# Patient Record
Sex: Female | Born: 2000 | Race: Black or African American | Hispanic: No | Marital: Single | Smoking: Never smoker
Health system: Southern US, Community
[De-identification: ages and names within clinical notes are randomized; demographics above are authoritative.]

## PROBLEM LIST (undated history)

## (undated) DIAGNOSIS — J45909 Unspecified asthma, uncomplicated: Secondary | ICD-10-CM

## (undated) DIAGNOSIS — E162 Hypoglycemia, unspecified: Secondary | ICD-10-CM

---

## 2020-09-26 ENCOUNTER — Emergency Department (HOSPITAL_COMMUNITY): Payer: Federal, State, Local not specified - PPO

## 2020-09-26 ENCOUNTER — Other Ambulatory Visit: Payer: Self-pay

## 2020-09-26 ENCOUNTER — Encounter (HOSPITAL_COMMUNITY): Payer: Self-pay

## 2020-09-26 ENCOUNTER — Emergency Department (HOSPITAL_COMMUNITY)
Admission: EM | Admit: 2020-09-26 | Discharge: 2020-09-26 | Disposition: A | Discharge status: Home / Self Care | Payer: Federal, State, Local not specified - PPO | Attending: Emergency Medicine | Admitting: Emergency Medicine

## 2020-09-26 DIAGNOSIS — Y93B9 Activity, other involving muscle strengthening exercises: Secondary | ICD-10-CM | POA: Insufficient documentation

## 2020-09-26 DIAGNOSIS — Z79899 Other long term (current) drug therapy: Secondary | ICD-10-CM | POA: Diagnosis not present

## 2020-09-26 DIAGNOSIS — J45909 Unspecified asthma, uncomplicated: Secondary | ICD-10-CM | POA: Diagnosis not present

## 2020-09-26 DIAGNOSIS — X501XXA Overexertion from prolonged static or awkward postures, initial encounter: Secondary | ICD-10-CM | POA: Diagnosis not present

## 2020-09-26 DIAGNOSIS — S83005A Unspecified dislocation of left patella, initial encounter: Secondary | ICD-10-CM | POA: Diagnosis not present

## 2020-09-26 DIAGNOSIS — M25562 Pain in left knee: Secondary | ICD-10-CM | POA: Diagnosis present

## 2020-09-26 HISTORY — DX: Hypoglycemia, unspecified: E16.2

## 2020-09-26 HISTORY — DX: Unspecified asthma, uncomplicated: J45.909

## 2020-09-26 MED ORDER — IBUPROFEN 800 MG PO TABS
800.0000 mg | ORAL_TABLET | Freq: Once | ORAL | Status: AC
  Administered 2020-09-26: 800 mg via ORAL
  Filled 2020-09-26: qty 1

## 2020-09-26 NOTE — ED Notes (Signed)
Patient transported to X-ray 

## 2020-09-26 NOTE — ED Provider Notes (Signed)
[facility]-EMERGENCY DEPT Provider Note   CSN: [id number] Arrival date & time: 09/26/20  1431     History Chief Complaint  Patient presents with  . Knee Injury    Lydia Pennington is a 20 y.o. female presenting for evaluation of left knee pain.  Patient states was prior to arrival she was working out on the floor when she rolled over into a kneeling position and simply felt her left kneecap pop out of place.  She reports acute onset pain.  She has not ambulated since.  No previous history of injury to the left knee or previous dislocation.  She denies numbness or tingling.  She received medication with EMS, improved her pain, she is not taking anything else.  She has no medical problems, takes medications daily.  She has minimal pain at rest, but lots of pain with movement.  HPI     Past Medical History:  Diagnosis Date  . Asthma   . Hypoglycemia     There are no problems to display for this patient.    OB History   No obstetric history on file.     No family history on file.     Home Medications Prior to Admission medications   Medication Sig Start Date End Date Taking? Authorizing Provider  acetaminophen (TYLENOL) 500 MG tablet Take 500 mg by mouth every 6 (six) hours as needed for moderate pain.   Yes [provider]  ferrous sulfate 325 (65 FE) MG EC tablet Take 325 mg by mouth daily.   Yes [provider]    Allergies    Patient has no known allergies.  Review of Systems   Review of Systems  Musculoskeletal: Positive for arthralgias, gait problem and joint swelling.  All other systems reviewed and are negative.   Physical Exam Updated Vital Signs BP 109/71 (BP Location: Left Arm)   Pulse 71   Temp 98.8 F (37.1 C) (Oral)   Resp 14   Ht 5\' 2"  (1.575 m)   Wt 56.7 kg   LMP 09/26/2020 (Exact Date)   SpO2 100%   BMI 22.86 kg/m   Physical Exam Vitals and nursing note reviewed.  Constitutional:      General: She  is not in acute distress.    Appearance: She is well-developed and well-nourished.     Comments: In NAD  HENT:     Head: Normocephalic and atraumatic.  Eyes:     Extraocular Movements: EOM normal.  Cardiovascular:     Rate and Rhythm: Normal rate.  Pulmonary:     Effort: Pulmonary effort is normal.  Abdominal:     General: There is no distension.  Musculoskeletal:        General: Deformity present.     Cervical back: Normal range of motion.     Comments: Obvious deformity of the left knee, left patella is laterally dislocated.  Tenderness palpation of the anterior knee.  No tenderness palpation of calf or thigh.  Pedal pulse 2+.  Good distal sensation.  Full active range of motion of the foot without difficulty.  Skin:    General: Skin is warm.     Capillary Refill: Capillary refill takes less than 2 seconds.     Findings: No rash.  Neurological:     Mental Status: She is alert and oriented to person, place, and time.  Psychiatric:        Mood and Affect: Mood and affect normal.     ED Results /  Procedures / Treatments   Labs (all labs ordered are listed, but only abnormal results are displayed) Labs Reviewed - No data to display  EKG None  Radiology DG Knee Complete 4 Views Left  Result Date: 09/26/2020 CLINICAL DATA:  Dislocation EXAM: LEFT KNEE - COMPLETE 4+ VIEW COMPARISON:  None. FINDINGS: No joint effusion. No acute fracture. Lateral dislocation of the patella. Joint spaces are preserved. IMPRESSION: Lateral dislocation of the patella.  No acute fracture. Electronically Signed   By: Guadlupe Spanish M.D.   On: 09/26/2020 15:50   DG Knee AP/LAT W/Sunrise Left  Result Date: 09/26/2020 CLINICAL DATA:  Reduction of lateral patellar dislocation EXAM: LEFT KNEE 3 VIEWS COMPARISON:  09/26/2020 FINDINGS: Frontal, lateral, and sunrise views of the left knee are obtained. The patellofemoral articulation is anatomic Lea aligned. Joint spaces are well preserved. No joint effusion. No  acute fracture. IMPRESSION: 1. Reduction of prior lateral patellar dislocation, with anatomic alignment in the patellofemoral compartment. Electronically Signed   By: Sharlet Salina M.D.   On: 09/26/2020 16:30    Procedures Reduction of dislocation  Date/Time: 09/26/2020 4:44 PM Performed by: Arby Barrette, MD Authorized by: Arby Barrette, MD  Consent: Verbal consent obtained. Risks and benefits: risks, benefits and alternatives were discussed Consent given by: patient Local anesthesia used: no  Anesthesia: Local anesthesia used: no  Sedation: Patient sedated: no  Patient tolerance: patient tolerated the procedure well with no immediate complications      Medications Ordered in ED Medications  ibuprofen (ADVIL) tablet 800 mg (800 mg Oral Given 09/26/20 1612)    ED Course  I have reviewed the triage vital signs and the nursing notes.  Pertinent labs & imaging results that were available during my care of the patient were reviewed by me and considered in my medical decision making (see chart for details).    MDM Rules/Calculators/A&P                          Patient presenting for evaluation of left knee pain.  On exam, patient has obvious deformity most consistent with a lateral patellar dislocation.  However her leg is currently fully extended, dislocation persist.  As such, will obtain x-rays to ensure no underlying fracture or abnormality.  X-ray viewed interpreted by me, shows lateral dislocation. No obvious fracture. Discussed with attending, Dr. Clarice Pole evaluated the patient. Patellar was reduced by Dr. Clarice Pole without complication or difficulty. Patient reports improvement of pain after dislocation. Will repeat films including sunrise view, place in the immobilizer, give crutches to use as needed. Will have patient follow-up with orthopedics.   Repeat x-ray shows successful reduction, no fracture noted. Discussed rest, ice, elevation. Encourage NSAID use. At this  time, patient appears safe for discharge. Return cautions given. Patient states she understands and agrees to plan.  Final Clinical Impression(s) / ED Diagnoses Final diagnoses:  Dislocation of left patella, initial encounter    Rx / DC Orders ED Discharge Orders    None       Alveria Apley, PA-C 09/26/20 1645    Arby Barrette, MD 09/29/20 [phone]

## 2020-09-26 NOTE — Progress Notes (Signed)
Orthopedic Tech Progress Note Patient Details:  Lydia Pennington August 18, 2000 [id number]  Ortho Devices Type of Ortho Device: Crutches,Knee Immobilizer Ortho Device/Splint Location: left Ortho Device/Splint Interventions: Application   Post Interventions Patient Tolerated: Well Instructions Provided: Care of device   Saul Fordyce 09/26/2020, 4:10 PM

## 2020-09-26 NOTE — ED Notes (Signed)
Ortho tech called for knee immobilizer and crutches 

## 2020-09-26 NOTE — ED Notes (Signed)
Provided ice pack

## 2020-09-26 NOTE — ED Triage Notes (Signed)
Pt came from wellness center at [facility]. Pt was working out on the floor, rolled over into a kneeling position, upon standing pt felt left kneecap pop out of place. Strong peripheral pulses. Pt denies any trauma or past injury to left knee.  A&O x4 Normally ambulatory 20 in right wrist, 150 mcg of pain Pain 2/10 while immobile No PMH or allergies 110/78 62 bpm 98% on RA 18 RR NSR

## 2020-09-26 NOTE — ED Provider Notes (Incomplete)
I provided a substantive portion of the care of this patient.  I personally performed the entirety of the {Shared Choices:25152} for this encounter. {Remember to document shared critical care using "edcritical" dot phrase:1}    

## 2020-09-26 NOTE — Discharge Instructions (Signed)
Limit walking, standing, impact, and repetitive bending, particularly if these activities cause pain. Apply ice and elevate the leg for 10 to 15 minutes four times daily. Use a brace during the day until you follow up with orthopedics.  Take ibuprofen 3 times a day with meals for the next week.  Do not take other anti-inflammatories at the same time (Advil, Motrin, naproxen, Aleve). You may supplement with Tylenol if you need further pain control. Follow-up with the orthopedic doctor listed below for further evaluation of your knee. Return to the emergency room with any new, worsening, concerning symptoms

## 2020-10-09 ENCOUNTER — Ambulatory Visit (INDEPENDENT_AMBULATORY_CARE_PROVIDER_SITE_OTHER): Payer: Federal, State, Local not specified - PPO | Admitting: Rehabilitative and Restorative Service Providers"

## 2020-10-09 ENCOUNTER — Other Ambulatory Visit: Payer: Self-pay

## 2020-10-09 ENCOUNTER — Ambulatory Visit: Payer: Self-pay

## 2020-10-09 ENCOUNTER — Encounter: Payer: Self-pay | Admitting: Rehabilitative and Restorative Service Providers"

## 2020-10-09 ENCOUNTER — Ambulatory Visit: Payer: Federal, State, Local not specified - PPO | Admitting: Orthopedic Surgery

## 2020-10-09 ENCOUNTER — Encounter: Payer: Self-pay | Admitting: Orthopedic Surgery

## 2020-10-09 DIAGNOSIS — R600 Localized edema: Secondary | ICD-10-CM

## 2020-10-09 DIAGNOSIS — M25662 Stiffness of left knee, not elsewhere classified: Secondary | ICD-10-CM | POA: Diagnosis not present

## 2020-10-09 DIAGNOSIS — S83005A Unspecified dislocation of left patella, initial encounter: Secondary | ICD-10-CM

## 2020-10-09 DIAGNOSIS — M6281 Muscle weakness (generalized): Secondary | ICD-10-CM

## 2020-10-09 DIAGNOSIS — R262 Difficulty in walking, not elsewhere classified: Secondary | ICD-10-CM

## 2020-10-09 NOTE — Progress Notes (Signed)
Office Visit Note   Patient: Lydia Pennington           Date of Birth: September 02, 2000           MRN: [id number] Visit Date: 10/09/2020 Requested by: No referring provider defined for this encounter. PCP: System, Provider Not In  Subjective: Chief Complaint  Patient presents with  . Left Knee - Pain, Follow-up    HPI: Lydia Pennington is a 20 y.o. female who presents to the office complaining of left knee pain.  Patient states that she was doing back squats with about 40 to 50 pounds on her back when she felt a pop in her left knee.  She reports lateral dislocation of the patella with subsequent reduction in the emergency department.  She has been ambulating full weightbearing in a knee immobilizer since then.  She states that she is doing well.  This is her first patellar dislocation.  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  Injury was about 2 weeks ago.  No other history of left knee injuries.  No history of left knee surgery..                ROS: All systems reviewed are negative as they relate to the chief complaint within the history of present illness.  Patient denies fevers or chills.  Assessment & Plan: Visit Diagnoses:  1. Closed dislocation of left patella, initial encounter     Plan: Patient is a 20 year old female who presents complaining of left knee pain.  She sustained first-time patella dislocation on 09/26/2020 while she was doing back squats in the gym.  This was reduced in the ED and she has been ambulating in a knee immobilizer.  Doing well in regards to pain control.  About 2 weeks out from injury now.  No significant tenderness on exam or any increased MPFL laxity.  Patient does have apprehension with lateral manipulation of the patella on the left compared to the right.  Plan to transition from knee immobilizer into a patella stabilizing brace to wear at all times.  Refer patient to physical therapy upstairs to work on patella stabilizing  exercises and quadricep strengthening.  Follow-up in 6 weeks for clinical recheck.  No need for MRI scan at this time but plan for scan if she has a recurrent dislocation.  No clinical or radiographic evidence of loose body or chondral defect.  Follow-Up Instructions: No follow-ups on file.   Orders:  Orders Placed This Encounter  Procedures  . XR KNEE 3 VIEW LEFT  . Ambulatory referral to Physical Therapy   No orders of the defined types were placed in this encounter.     Procedures: No procedures performed   Clinical Data: No additional findings.  Objective: Vital Signs: LMP 09/26/2020 (Exact Date)   Physical Exam:  Constitutional: Patient appears well-developed HEENT:  Head: Normocephalic Eyes:EOM are normal Neck: Normal range of motion Cardiovascular: Normal rate Pulmonary/chest: Effort normal Neurologic: Patient is alert Skin: Skin is warm Psychiatric: Patient has normal mood and affect  Ortho Exam: Ortho exam demonstrates left knee without effusion.  Positive patellar apprehension on the left that is not present on the right.  No increased MPFL laxity of the left knee compared with the right knee.  No tenderness over the medial lateral joint lines.  Negative Lachman exam.  Active anterior/posterior drawer.  No instability to varus/valgus stress.  No calf tenderness.  Negative Homans' sign.  No  tenderness over the medial aspect of the patella or the MPFL insertion on the femur.  Specialty Comments:  No specialty comments available.  Imaging: No results found.   PMFS History: There are no problems to display for this patient.  Past Medical History:  Diagnosis Date  . Asthma   . Hypoglycemia     No family history on file.  No past surgical history on file. Social History   Occupational History  . Not on file  Tobacco Use  . Smoking status: Not on file  . Smokeless tobacco: Not on file  Substance and Sexual Activity  . Alcohol use: Not on file  . Drug  use: Not on file  . Sexual activity: Not on file

## 2020-10-09 NOTE — Therapy (Signed)
[facility] Physical Therapy [address] [place], [place], [address] Phone: [phone]   Fax:  [phone]  Physical Therapy Evaluation  Patient Details  Name: Lydia Pennington MRN: [id number] Date of Birth: [birth year] Referring Provider (PT): Cammy Copa MD   Encounter Date: 10/09/2020   PT End of Session - 10/09/20 1617    Visit Number 1    Number of Visits 8    PT Start Time 1437    PT Stop Time 1513    PT Time Calculation (min) 36 min    Equipment Utilized During Treatment Left knee immobilizer    Activity Tolerance Patient tolerated treatment well;No increased pain;Patient limited by fatigue    Behavior During Therapy [facility] for tasks assessed/performed           Past Medical History:  Diagnosis Date  . Asthma   . Hypoglycemia     History reviewed. No pertinent surgical history.  There were no vitals filed for this visit.    Subjective Assessment - 10/09/20 1445    Subjective 09/26/20 working out at the gym dislocated L knee at bottom of squat.  Pain but not requiring pain medication.    Pertinent History Asthma    Limitations Standing;Walking    How long can you sit comfortably? Stiffens with prolonged sitting    How long can you stand comfortably? 2 hours    How long can you walk comfortably? 15 minutes    Patient Stated Goals Get out of brace and back to normal activities    Currently in Pain? No/denies              [facility] PT Assessment - 10/09/20 0001      Assessment   Medical Diagnosis L patellar dislocation    Referring Provider (PT) Cammy Copa MD    Onset Date/Surgical Date 09/26/20      Precautions   Required Braces or Orthoses Knee Immobilizer - Left    Knee Immobilizer - Left On when out of bed or walking      Restrictions   Weight Bearing Restrictions No      Balance Screen   Has the patient fallen in the past 6 months No    Has the patient had a decrease in activity level because of a fear of falling?  No     Is the patient reluctant to leave their home because of a fear of falling?  No      Prior Function   Level of Independence Independent    Vocation Student    Leisure Regular at the gym      Cognition   Overall Cognitive Status Within Functional Limits for tasks assessed      Observation/Other Assessments   Focus on Therapeutic Outcomes (FOTO)  63 (Goal 90)      ROM / Strength   AROM / PROM / Strength AROM;Strength      AROM   Overall AROM  Deficits    AROM Assessment Site Knee    Right/Left Knee Left;Right    Right Knee Extension 0    Right Knee Flexion 148    Left Knee Extension 0    Left Knee Flexion 140      Strength   Overall Strength Comments L 50.5 cm/R 51.5 cm thigh girth assessed 15 cm proximal to the superior patellar pole                      Objective measurements completed on  examination: See above findings.       [facility] Adult PT Treatment/Exercise - 10/09/20 0001      Exercises   Exercises Knee/Hip      Knee/Hip Exercises: Seated   Long Arc Quad Strengthening;Both;4 sets;5 reps    Long Arc Quad Limitations Seated straight leg raises      Knee/Hip Exercises: Supine   Quad Sets Strengthening;Both;2 sets;10 reps;Other (comment)   5 seconds B                 PT Education - 10/09/20 1614    Education Details Reviewed exam findings, discussed avoiding locking knees out and reviewed POC and starter HEP.    Person(s) Educated Patient    Methods Explanation;Demonstration;Verbal cues;Handout    Comprehension Verbal cues required;Need further instruction;Returned demonstration;Verbalized understanding            PT Short Term Goals - 10/09/20 1621      PT SHORT TERM GOAL #1   Title Lydia Pennington will be independent with her home and gym program.    Time 4    Period Weeks    Status New    Target Date 11/06/20             PT Long Term Goals - 10/09/20 1622      PT LONG TERM GOAL #1   Title Lydia Pennington will have < 0.5 cm L thigh  atrophy assessed 15 cm proximal to the superior patellar pole.    Baseline 1 cm    Time 8    Period Weeks    Status New    Target Date 12/04/20      PT LONG TERM GOAL #2   Title Lydia Pennington will be able to return to the gym without any resitritions.    Time 8    Period Weeks    Status New    Target Date 12/04/20      PT LONG TERM GOAL #3   Title Improve FOTO to 90.    Baseline 63    Time 8    Period Weeks    Status New    Target Date 12/04/20      PT LONG TERM GOAL #4   Title Lydia Pennington will be independent with her long-term HEP.    Time 8    Period Weeks    Status New    Target Date 12/04/20                  Plan - 10/09/20 1618    Clinical Impression Statement Lydia Pennington dislocated her L patella when doing squats in the gym.  She is a workout regular and has decent quadriceps strength (1 cm atrophy assessed 15 cm proximal to the superior patellar pole).  AROM is mildly limited and quadriceps strength/knee stability will be the focus of her home exercise and clinic program.    Examination-Activity Limitations Squat;Bend;Locomotion Level;Stairs;Stand    Examination-Participation Restrictions Occupation;Community Activity    Stability/Clinical Decision Making Stable/Uncomplicated    Clinical Decision Making Low    Rehab Potential Good    PT Frequency 1x / week    PT Duration 8 weeks    PT Treatment/Interventions ADLs/Self Care Home Management;Cryotherapy;Retail banker;Therapeutic activities;Neuromuscular re-education;Balance training;Therapeutic exercise;Patient/family education;Vasopneumatic Device    PT Next Visit Plan Progress to machines she can do at the [facility] gym and proprioception along with increased quadriceps strength challenge.    PT Home Exercise Plan Access Code: [email or web address]    Consulted and Agree with Plan  of Care Patient           Patient will benefit from skilled therapeutic intervention in order to improve the following  deficits and impairments:  Abnormal gait,Decreased endurance,Decreased range of motion,Decreased strength,Difficulty walking,Increased edema,Pain  Visit Diagnosis: Difficulty in walking, not elsewhere classified  Localized edema  Muscle weakness (generalized)  Stiffness of left knee, not elsewhere classified     Problem List There are no problems to display for this patient.   Cherlyn Cushing PT, MPT 10/09/2020, 4:25 PM  [facility] Physical Therapy [address] [place], [place], [address] Phone: [phone]   Fax:  [phone]  Name: Lydia Pennington MRN: [id number] Date of Birth: 10-20-2000

## 2020-10-09 NOTE — Patient Instructions (Signed)
Access Code: [email or web address] URL: [email or web address] Date: 10/09/2020 Prepared by: Pauletta Browns  Exercises Supine Quadricep Sets - 2-3 x daily - 7 x weekly - 3-5 sets - 10 reps - 5 seconds hold Small Range Straight Leg Raise - 2-3 x daily - 7 x weekly - 4-10 sets - 5 reps - 3 seconds hold

## 2020-10-17 ENCOUNTER — Encounter: Payer: Self-pay | Admitting: Rehabilitative and Restorative Service Providers"

## 2020-10-17 ENCOUNTER — Other Ambulatory Visit: Payer: Self-pay

## 2020-10-17 ENCOUNTER — Ambulatory Visit (INDEPENDENT_AMBULATORY_CARE_PROVIDER_SITE_OTHER): Payer: Federal, State, Local not specified - PPO | Admitting: Rehabilitative and Restorative Service Providers"

## 2020-10-17 DIAGNOSIS — R600 Localized edema: Secondary | ICD-10-CM

## 2020-10-17 DIAGNOSIS — M25662 Stiffness of left knee, not elsewhere classified: Secondary | ICD-10-CM

## 2020-10-17 DIAGNOSIS — M6281 Muscle weakness (generalized): Secondary | ICD-10-CM | POA: Diagnosis not present

## 2020-10-17 DIAGNOSIS — R262 Difficulty in walking, not elsewhere classified: Secondary | ICD-10-CM

## 2020-10-17 NOTE — Therapy (Signed)
[facility] Physical Therapy [address] [place], [place], [address] Phone: [phone]   Fax:  [phone]  Physical Therapy Treatment  Patient Details  Name: Lydia Pennington MRN: [id number] Date of Birth: 2001-02-26 Referring Provider (PT): Cammy Copa MD   Encounter Date: 10/17/2020   PT End of Session - 10/17/20 1151    Visit Number 2    Number of Visits 8    Date for PT Re-Evaluation 12/04/20    Progress Note Due on Visit 10    PT Start Time 1144    PT Stop Time 1223    PT Time Calculation (min) 39 min    Equipment Utilized During Treatment Left knee immobilizer   on upon arrival   Activity Tolerance Patient tolerated treatment well;No increased pain    Behavior During Therapy WFL for tasks assessed/performed           Past Medical History:  Diagnosis Date  . Asthma   . Hypoglycemia     History reviewed. No pertinent surgical history.  There were no vitals filed for this visit.   Subjective Assessment - 10/17/20 1149    Subjective Pt. reported no pain since last visit.  Doing well c HEP.  Pt. stated she tried to get brace but they were closed so still wearing immobilizer.    Pertinent History Asthma    Limitations Standing;Walking    How long can you sit comfortably? Stiffens with prolonged sitting    How long can you stand comfortably? 2 hours    How long can you walk comfortably? 15 minutes    Patient Stated Goals Get out of brace and back to normal activities    Currently in Pain? No/denies              [facility] PT Assessment - 10/17/20 0001      Assessment   Medical Diagnosis Lt patellar dislocation    Referring Provider (PT) Cammy Copa MD    Onset Date/Surgical Date 09/26/20      Precautions   Required Braces or Orthoses Other Brace/Splint    Knee Immobilizer - Left Other (comment)   MD note prior to PT evaluation:  "Plan to transition from knee immobilizer into a patella stabilizing brace to wear at all times."    Other Brace/Splint Patellar stabilization brace                         [facility] Adult PT Treatment/Exercise - 10/17/20 0001      Knee/Hip Exercises: Aerobic   Recumbent Bike Lvl 3 10 mins      Knee/Hip Exercises: Machines for Strengthening   Cybex Leg Press Lt LE SL 3 x 15 75 lbs, performed bilateral c eccentric lowering focus      Knee/Hip Exercises: Standing   Other Standing Knee Exercises wall sit to fatigue x 3      Knee/Hip Exercises: Seated   Other Seated Knee/Hip Exercises SLR 2 x 15 bilateral      Knee/Hip Exercises: Supine   Bridges with Ball Squeeze 3 sets;15 reps    Straight Leg Raises 15 reps;Both      Knee/Hip Exercises: Sidelying   Other Sidelying Knee/Hip Exercises side plank on knee c contralateral SLR 2 x 10 bilateral                  PT Education - 10/17/20 1202    Education Details HEP progression    Person(s) Educated Patient  Methods Explanation;Demonstration;Verbal cues;Handout    Comprehension Returned demonstration;Verbalized understanding            PT Short Term Goals - 10/17/20 1150      PT SHORT TERM GOAL #1   Title [name] will be independent with her home and gym program.    Time 4    Period Weeks    Status On-going    Target Date 11/06/20             PT Long Term Goals - 10/09/20 1622      PT LONG TERM GOAL #1   Title [name] will have < 0.5 cm L thigh atrophy assessed 15 cm proximal to the superior patellar pole.    Baseline 1 cm    Time 8    Period Weeks    Status New    Target Date 12/04/20      PT LONG TERM GOAL #2   Title [name] will be able to return to the gym without any resitritions.    Time 8    Period Weeks    Status New    Target Date 12/04/20      PT LONG TERM GOAL #3   Title Improve FOTO to 90.    Baseline 63    Time 8    Period Weeks    Status New    Target Date 12/04/20      PT LONG TERM GOAL #4   Title [name] will be independent with her long-term HEP.    Time 8    Period  Weeks    Status New    Target Date 12/04/20                 Plan - 10/17/20 1203    Clinical Impression Statement No indications of pain or abnormal response to HEP or additional intervention performed in clinic.  Continued progressive strengthening and movement to non compliant to compliant surface balance to improve neuro muscular control indicated in future visits.    Examination-Activity Limitations Squat;Bend;Locomotion Level;Stairs;Stand    Examination-Participation Restrictions Occupation;Community Activity    Stability/Clinical Decision Making Stable/Uncomplicated    Rehab Potential Good    PT Frequency 1x / week    PT Duration 8 weeks    PT Treatment/Interventions ADLs/Self Care Home Management;Cryotherapy;Retail banker;Therapeutic activities;Neuromuscular re-education;Balance training;Therapeutic exercise;Patient/family education;Vasopneumatic Device    PT Next Visit Plan Continue progressive strengthening intervention progression, neuro muscular non compliant to compliant surface progression. Continue follow up on Pt. getting new brace.  BFR may be helpful in clinic activity.    PT Home Exercise Plan Access Code: [email or web address]    Consulted and Agree with Plan of Care Patient           Patient will benefit from skilled therapeutic intervention in order to improve the following deficits and impairments:  Abnormal gait,Decreased endurance,Decreased range of motion,Decreased strength,Difficulty walking,Increased edema,Pain  Visit Diagnosis: Stiffness of left knee, not elsewhere classified  Muscle weakness (generalized)  Difficulty in walking, not elsewhere classified  Localized edema     Problem List There are no problems to display for this patient.   Chyrel Masson, PT, DPT, OCS, ATC 10/17/20  12:13 PM    [facility] Physical Therapy [address] [place], [place], [address] Phone: [phone]   Fax:   [phone]  Name: Lydia Pennington MRN: [id number] Date of Birth: [birth year]

## 2020-10-17 NOTE — Patient Instructions (Signed)
Access Code: [email or web address] URL: [email or web address] Date: 10/17/2020 Prepared by: Chyrel Masson  Exercises Supine Quadricep Sets - 2-3 x daily - 7 x weekly - 3-5 sets - 10 reps - 5 seconds hold Small Range Straight Leg Raise - 2-3 x daily - 7 x weekly - 4-10 sets - 5 reps - 3 seconds hold Supine Bridge with Mini Swiss Ball Between Knees - 2-3 x daily - 7 x weekly - 3 sets - 10 reps Modified Side Plank with Hip Abduction - 2-3 x daily - 7 x weekly - 3 sets - 10 reps Wall Quarter Squat - 2-3 x daily - 7 x weekly - 1 sets - 5 reps

## 2020-10-24 ENCOUNTER — Encounter: Payer: Federal, State, Local not specified - PPO | Admitting: Rehabilitative and Restorative Service Providers"

## 2020-10-24 ENCOUNTER — Telehealth: Payer: Self-pay | Admitting: Rehabilitative and Restorative Service Providers"

## 2020-10-24 NOTE — Telephone Encounter (Signed)
Spoke with Lydia Pennington.  She forgot her appointment but will be here next Friday at 8:45 AM.

## 2020-10-31 ENCOUNTER — Ambulatory Visit (INDEPENDENT_AMBULATORY_CARE_PROVIDER_SITE_OTHER): Payer: Federal, State, Local not specified - PPO | Admitting: Rehabilitative and Restorative Service Providers"

## 2020-10-31 ENCOUNTER — Encounter: Payer: Self-pay | Admitting: Rehabilitative and Restorative Service Providers"

## 2020-10-31 ENCOUNTER — Other Ambulatory Visit: Payer: Self-pay

## 2020-10-31 DIAGNOSIS — R600 Localized edema: Secondary | ICD-10-CM | POA: Diagnosis not present

## 2020-10-31 DIAGNOSIS — M6281 Muscle weakness (generalized): Secondary | ICD-10-CM

## 2020-10-31 DIAGNOSIS — R262 Difficulty in walking, not elsewhere classified: Secondary | ICD-10-CM

## 2020-10-31 DIAGNOSIS — M25662 Stiffness of left knee, not elsewhere classified: Secondary | ICD-10-CM

## 2020-10-31 NOTE — Therapy (Signed)
[facility] Physical Therapy [address] [place], [place], [address] Phone: [phone]   Fax:  [phone]  Physical Therapy Treatment/Discharge  Patient Details  Name: Lydia Pennington MRN: [id number] Date of Birth: Jan 17, 2001 Referring Provider (PT): Meredith Pel MD  PHYSICAL THERAPY DISCHARGE SUMMARY  Visits from [facility]: 2  Current functional level related to goals / functional outcomes: See note   Remaining deficits: See note   Education / Equipment: HEP  Plan: Patient agrees to discharge.  Patient goals were not met. Patient is being discharged due to meeting the stated rehab goals.  ?????     Encounter Date: 10/31/2020   PT End of Session - 10/31/20 1633    Visit Number 3    Number of Visits 8    Date for PT Re-Evaluation 12/04/20    Progress Note Due on Visit 10    PT Start Time 0902    PT Stop Time 0930    PT Time Calculation (min) 28 min    Equipment Utilized During Treatment Left knee immobilizer   on upon arrival   Activity Tolerance Patient tolerated treatment well;No increased pain    Behavior During Therapy WFL for tasks assessed/performed           Past Medical History:  Diagnosis Date  . Asthma   . Hypoglycemia     History reviewed. No pertinent surgical history.  There were no vitals filed for this visit.   Subjective Assessment - 10/31/20 0911    Subjective Lydia Pennington reports minimal pain this week and no instability.    Pertinent History Asthma    Limitations Standing;Walking    How long can you sit comfortably? Stiffens with prolonged sitting    How long can you stand comfortably? Unlimited (was 2 hours)    How long can you walk comfortably? 30 minutes (was 15 minutes)    Patient Stated Goals Get out of brace and back to normal activities    Currently in Pain? No/denies    Multiple Pain Sites No              [facility] PT Assessment - 10/31/20 0001      Observation/Other Assessments   Focus on  Therapeutic Outcomes (FOTO)  99 (was 63, Goal 90)                         [facility] Adult PT Treatment/Exercise - 10/31/20 0001      Exercises   Exercises Knee/Hip      Knee/Hip Exercises: Seated   Long Arc Quad Strengthening;Both;4 sets;5 reps    Long Arc Quad Weight 2 lbs.    Long Arc Quad Limitations Seated straight leg raises                  PT Education - 10/31/20 [phone]    Education Details Reviewed importance of HEP compliance.  Reviewed HEP.    Person(s) Educated Patient    Methods Explanation;Demonstration;Verbal cues    Comprehension Verbalized understanding;Returned demonstration;Verbal cues required            PT Short Term Goals - 10/31/20 0922      PT SHORT TERM GOAL #1   Title Lydia Pennington will be independent with her home and gym program.    Time 4    Period Weeks    Status Achieved    Target Date 11/06/20             PT Long Term Goals -  10/31/20 0922      PT LONG TERM GOAL #1   Title Lydia Pennington will have < 0.5 cm L thigh atrophy assessed 15 cm proximal to the superior patellar pole.    Baseline 1 cm    Time 8    Period Weeks    Status On-going      PT LONG TERM GOAL #2   Title Lydia Pennington will be able to return to the gym without any resitritions.    Time 8    Period Weeks    Status Achieved      PT LONG TERM GOAL #3   Title Improve FOTO to 90.    Baseline 99, was 63    Time 8    Period Weeks    Status Achieved      PT LONG TERM GOAL #4   Title Eureka will be independent with her long-term HEP.    Time 8    Period Weeks    Status Achieved                 Plan - 10/31/20 1633    Clinical Impression Statement Lydia Pennington has had no instability, pain or subluxations since starting PT.  She has demonstrated independence and compliance with her home exercise program emphasizing eccentric quadriceps strength.  With consistent HEP compliance, remaining atrophy should improve and all long-term goals should be met.  Lydia Pennington was given the  option of independent rehabilitation or continued supervised PT.  She chose to continue independently.    Examination-Activity Limitations Squat;Bend;Locomotion Level;Stairs;Stand    Examination-Participation Restrictions Occupation;Community Activity    Stability/Clinical Decision Making Stable/Uncomplicated    Rehab Potential Good    PT Frequency 1x / week    PT Duration 8 weeks    PT Treatment/Interventions ADLs/Self Care Home Management;Cryotherapy;Occupational psychologist;Therapeutic activities;Neuromuscular re-education;Balance training;Therapeutic exercise;Patient/family education;Vasopneumatic Device    PT Next Visit Plan Continue progressive strengthening intervention progression, neuro muscular non compliant to compliant surface progression. Continue follow up on Pt. getting new brace.  BFR may be helpful in clinic activity.    PT Home Exercise Plan Access Code: [email or web address]    Consulted and Agree with Plan of Care Patient           Patient will benefit from skilled therapeutic intervention in order to improve the following deficits and impairments:  Abnormal gait,Decreased endurance,Decreased range of motion,Decreased strength,Difficulty walking,Increased edema,Pain  Visit Diagnosis: Muscle weakness (generalized)  Difficulty in walking, not elsewhere classified  Localized edema  Stiffness of left knee, not elsewhere classified     Problem List There are no problems to display for this patient.   Lydia Pennington PT, MPT 10/31/2020, 4:38 PM  [facility] Physical Therapy [address] [place], [place], [address] Phone: [phone]   Fax:  [phone]  Name: Lydia Pennington MRN: [id number] Date of Birth: 2001-01-26

## 2020-11-05 ENCOUNTER — Encounter: Payer: Federal, State, Local not specified - PPO | Admitting: Rehabilitative and Restorative Service Providers"

## 2020-11-19 ENCOUNTER — Ambulatory Visit (INDEPENDENT_AMBULATORY_CARE_PROVIDER_SITE_OTHER): Payer: Federal, State, Local not specified - PPO | Admitting: Orthopedic Surgery

## 2020-11-19 ENCOUNTER — Other Ambulatory Visit: Payer: Self-pay

## 2020-11-19 DIAGNOSIS — S83005A Unspecified dislocation of left patella, initial encounter: Secondary | ICD-10-CM | POA: Diagnosis not present

## 2020-11-23 ENCOUNTER — Encounter: Payer: Self-pay | Admitting: Orthopedic Surgery

## 2020-11-23 NOTE — Progress Notes (Signed)
Office Visit Note   Patient: Lydia Pennington           Date of Birth: Oct 18, 2000           MRN: [id number] Visit Date: 11/19/2020 Requested by: No referring provider defined for this encounter. PCP: System, Provider Not In  Subjective: Chief Complaint  Patient presents with  . Left Knee - Follow-up    HPI: Lydia Pennington is a 20 y.o. female who presents to the office for follow-up of left knee patellar dislocation.  She returns for reevaluation.  She is a first-time patellar dislocator.  She has been going to physical therapy and using patellar stabilizing brace.  She has completed physical therapy and has transition to home exercise program focusing on quadricep strengthening.  She is also return to the gym but has not returned to doing back squats.  She is doing some body weight squats.  She uses the brace when she is working out.  Overall she feels pretty much back to normal and denies any recurrent instability or any persistent pain or discomfort.  She is currently a Consulting civil engineer and works at [employer] and her patellar dislocation has not affected her studies or work life.              ROS: All systems reviewed are negative as they relate to the chief complaint within the history of present illness.  Patient denies fevers or chills.  Assessment & Plan: Visit Diagnoses:  1. Closed dislocation of left patella, initial encounter     Plan: Patient is a 20 year old female who presents for reevaluation following first-time left knee patellar dislocation.  She has completed physical therapy and is doing home exercise program.  She has returned to the gym but has not returned doing back squats.  She is using her patellar brace at the gym when she is exercising.  Overall she is doing very well and has no complaints of discomfort or recurrent instability.  Recommended patient keep up with her home exercise program to prevent any future dislocation events.  Encouraged her to return to the office if she  has another instability event as that may necessitate further imaging with MRI to rule out MPFL rupture.  Patient agreed with this plan.  Follow-up as needed.  Follow-Up Instructions: No follow-ups on file.   Orders:  No orders of the defined types were placed in this encounter.  No orders of the defined types were placed in this encounter.     Procedures: No procedures performed   Clinical Data: No additional findings.  Objective: Vital Signs: There were no vitals taken for this visit.  Physical Exam:  Constitutional: Patient appears well-developed HEENT:  Head: Normocephalic Eyes:EOM are normal Neck: Normal range of motion Cardiovascular: Normal rate Pulmonary/chest: Effort normal Neurologic: Patient is alert Skin: Skin is warm Psychiatric: Patient has normal mood and affect  Ortho Exam: Ortho exam demonstrates left knee without effusion.  No tenderness over the attachments of the MPFL.  No patellar apprehension.  No increased laxity with lateral movement of the left patella compared with the contralateral side.  No calf tenderness.  No joint line tenderness.  Negative patellar grind test.  Able to perform straight leg raise.  0 degrees of extension and greater than 125 degrees of knee flexion.  Specialty Comments:  No specialty comments available.  Imaging: No results found.   PMFS History: There are no problems to display for this patient.  Past Medical History:  Diagnosis Date  .  Asthma   . Hypoglycemia     No family history on file.  No past surgical history on file. Social History   Occupational History  . Not on file  Tobacco Use  . Smoking status: Never Smoker  . Smokeless tobacco: Never Used  Substance and Sexual Activity  . Alcohol use: Not on file  . Drug use: Not on file  . Sexual activity: Not on file

## 2022-06-25 IMAGING — CR DG KNEE COMPLETE 4+V*L*
4 series · 4 of 4 positions shown · non-contrast
Comparison: None.

CLINICAL DATA: Dislocation

EXAM:
LEFT KNEE - COMPLETE 4+ VIEW

[x knee obl left]
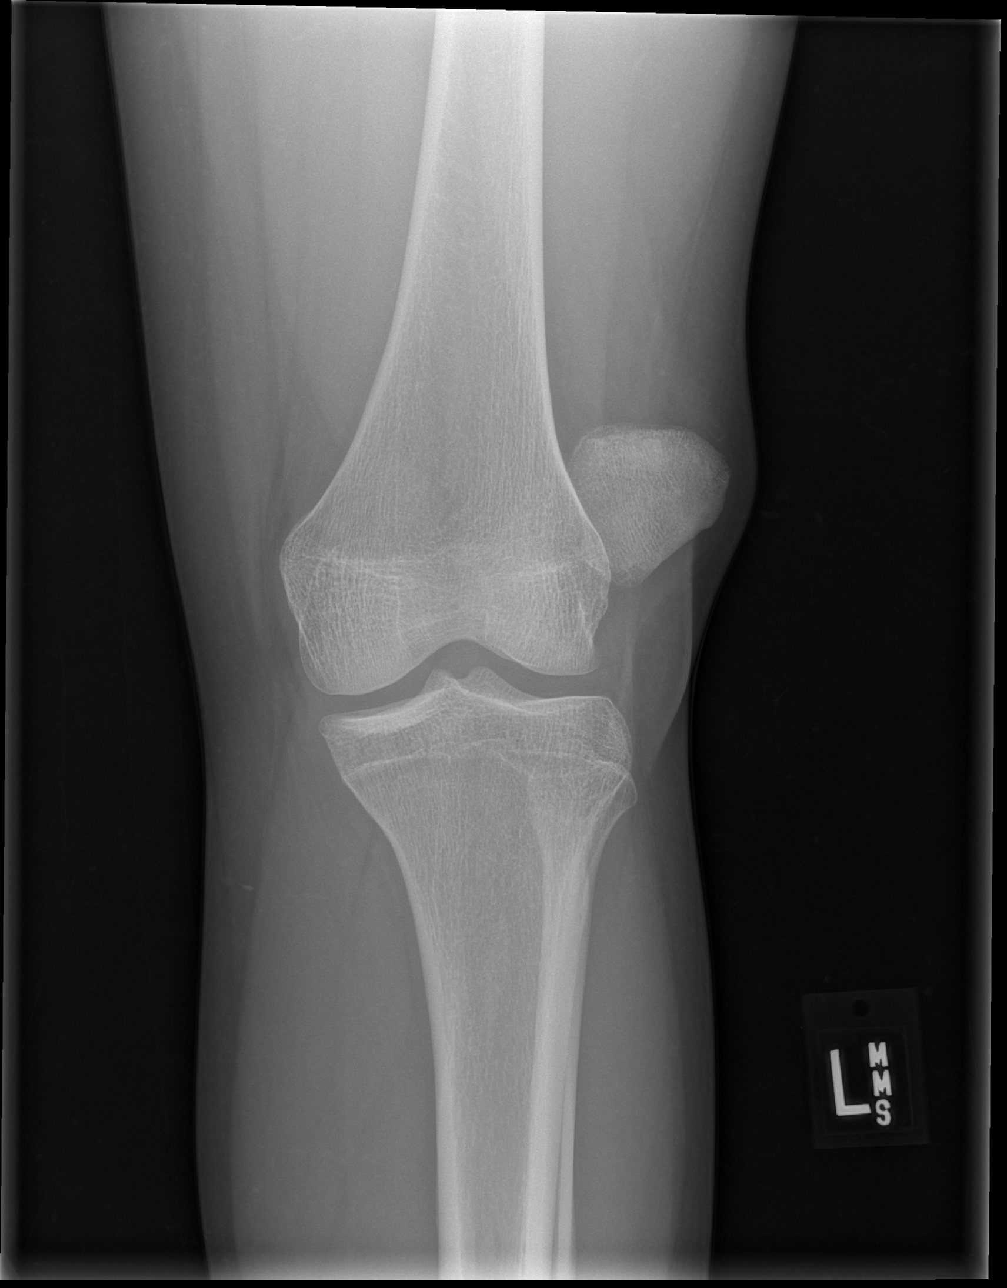

[x knee ap left (1 of 2)]
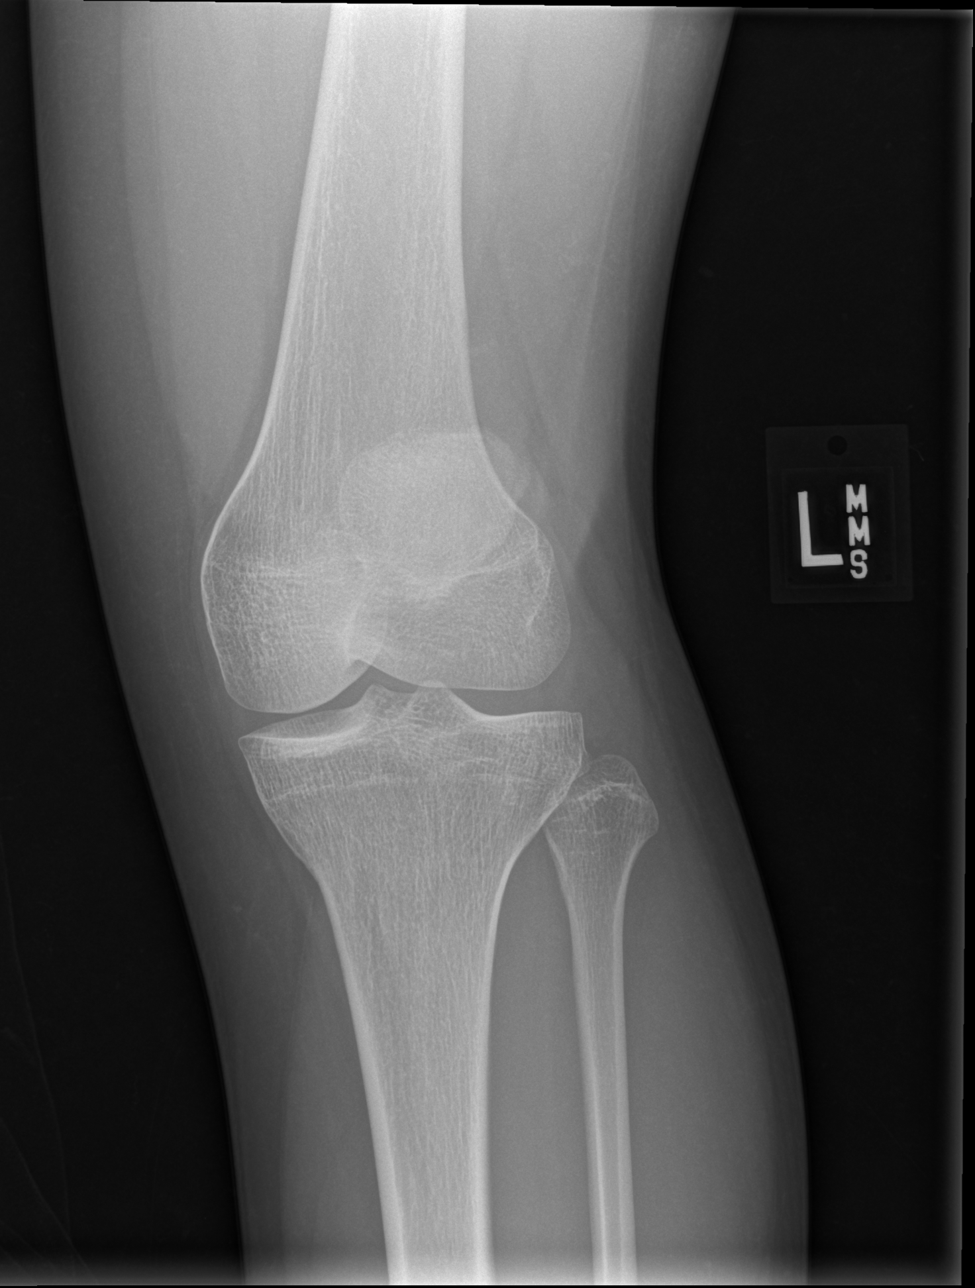

[x knee ap left (2 of 2)]
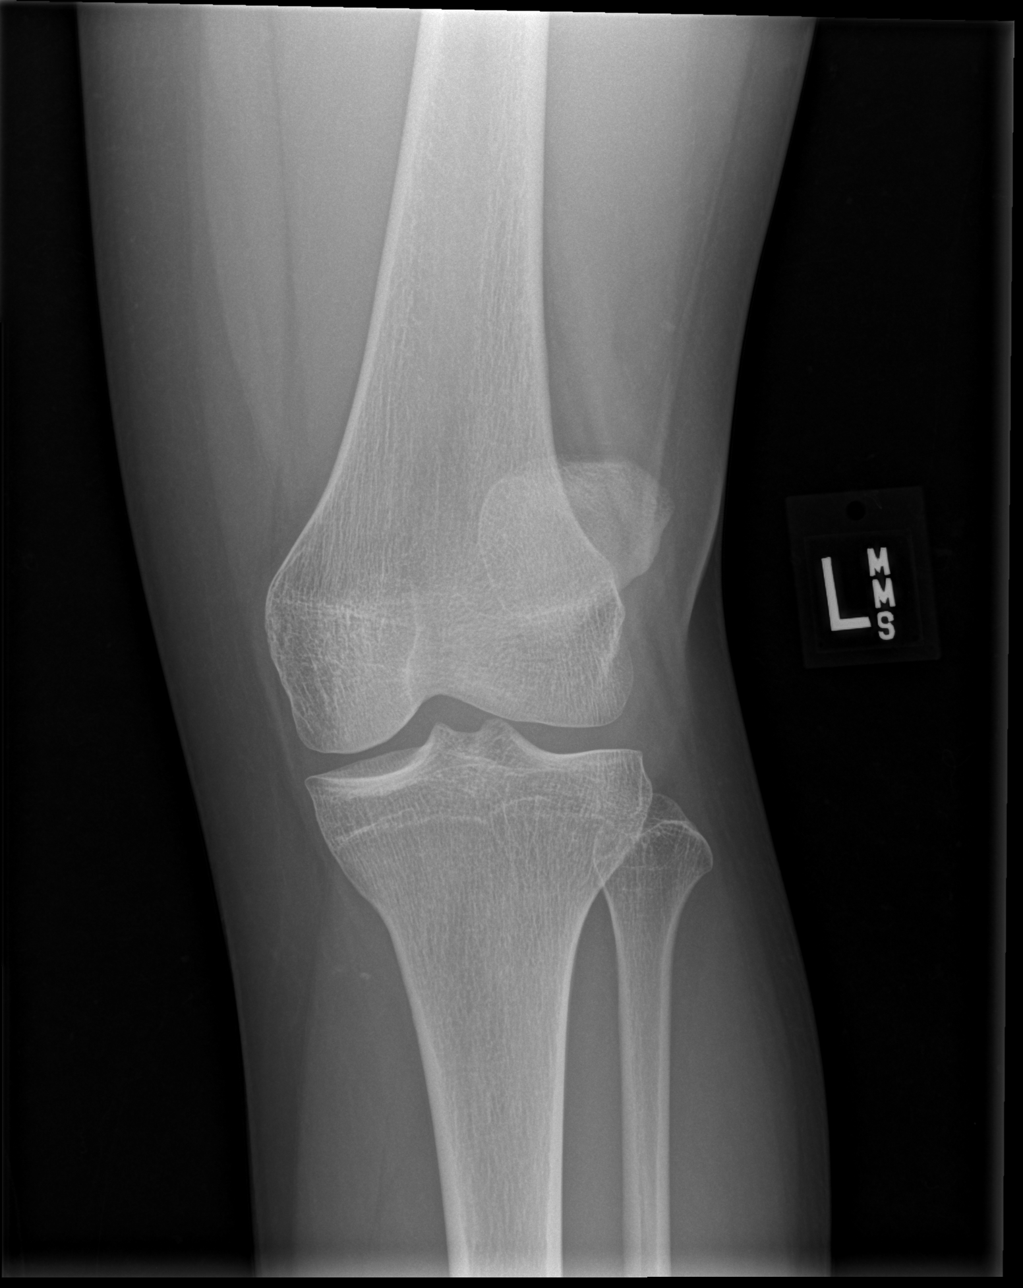

[x knee lat left]
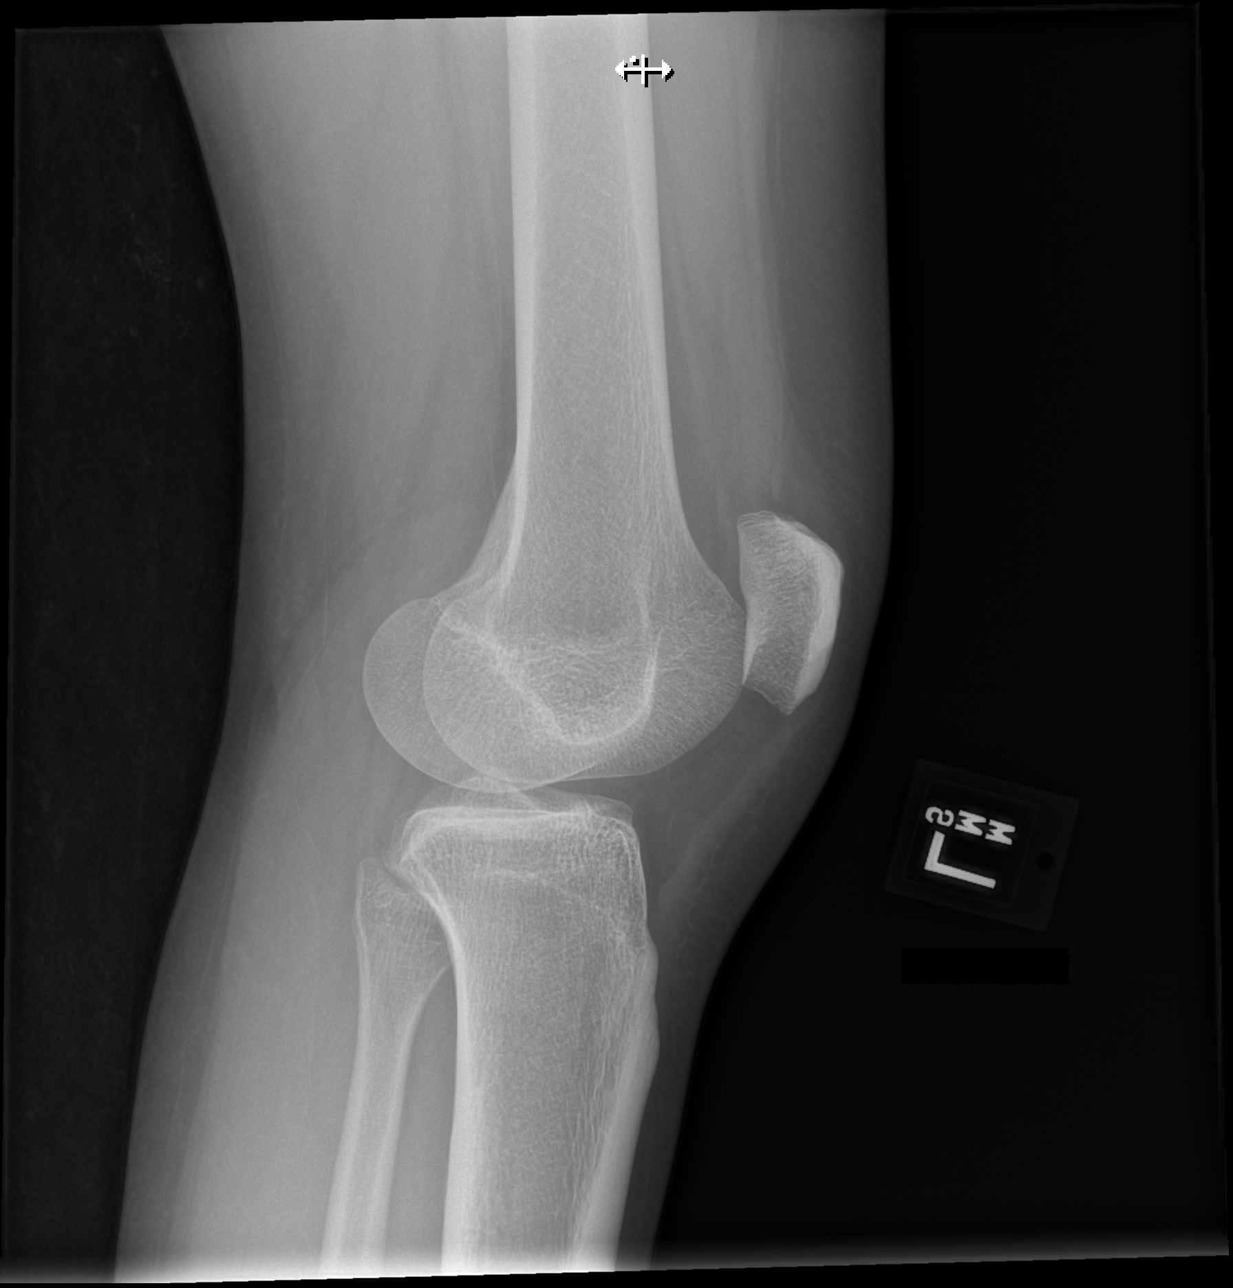

[4 of 4 positions shown; findings below may reference images not displayed]

FINDINGS: No joint effusion. No acute fracture. Lateral dislocation of the
patella. Joint spaces are preserved.
IMPRESSION: Lateral dislocation of the patella.  No acute fracture.
